# Patient Record
Sex: Male | Born: 1995 | Race: Black or African American | Hispanic: No | Marital: Single | Smoking: Never smoker
Health system: Southern US, Community
[De-identification: ages and names within clinical notes are randomized; demographics above are authoritative.]

## PROBLEM LIST (undated history)

## (undated) DIAGNOSIS — E119 Type 2 diabetes mellitus without complications: Secondary | ICD-10-CM

---
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
## 2018-05-06 IMAGING — CR DG ELBOW COMPLETE 3+V*L*
4 series · 4 of 4 positions shown · non-contrast
Comparison: None.

CLINICAL DATA: Posterior left elbow pain.  No injury.

EXAM:
LEFT ELBOW - COMPLETE 3+ VIEW

[x elbow joint ap left]
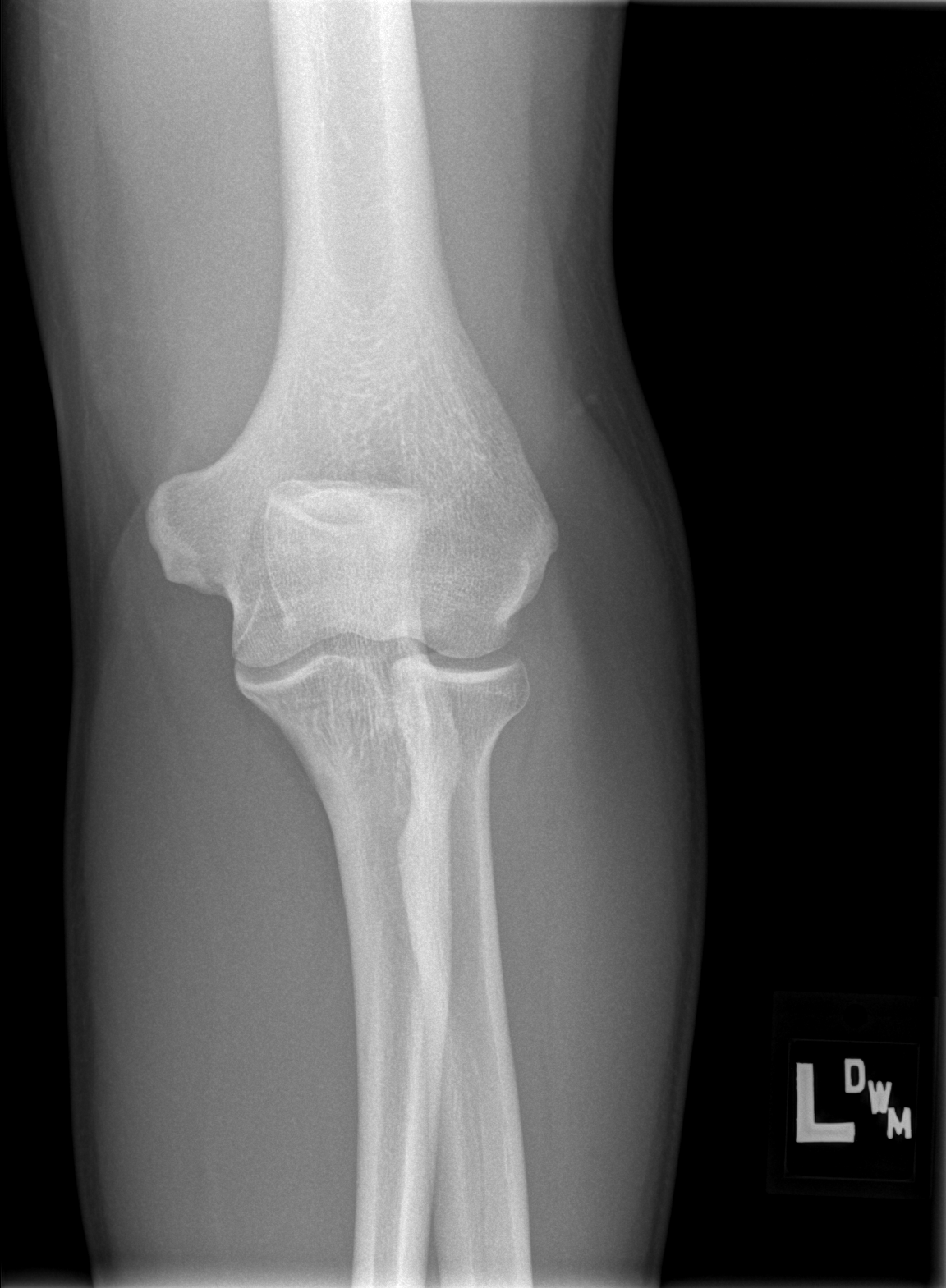

[x elbow joint obl. left (1 of 2)]
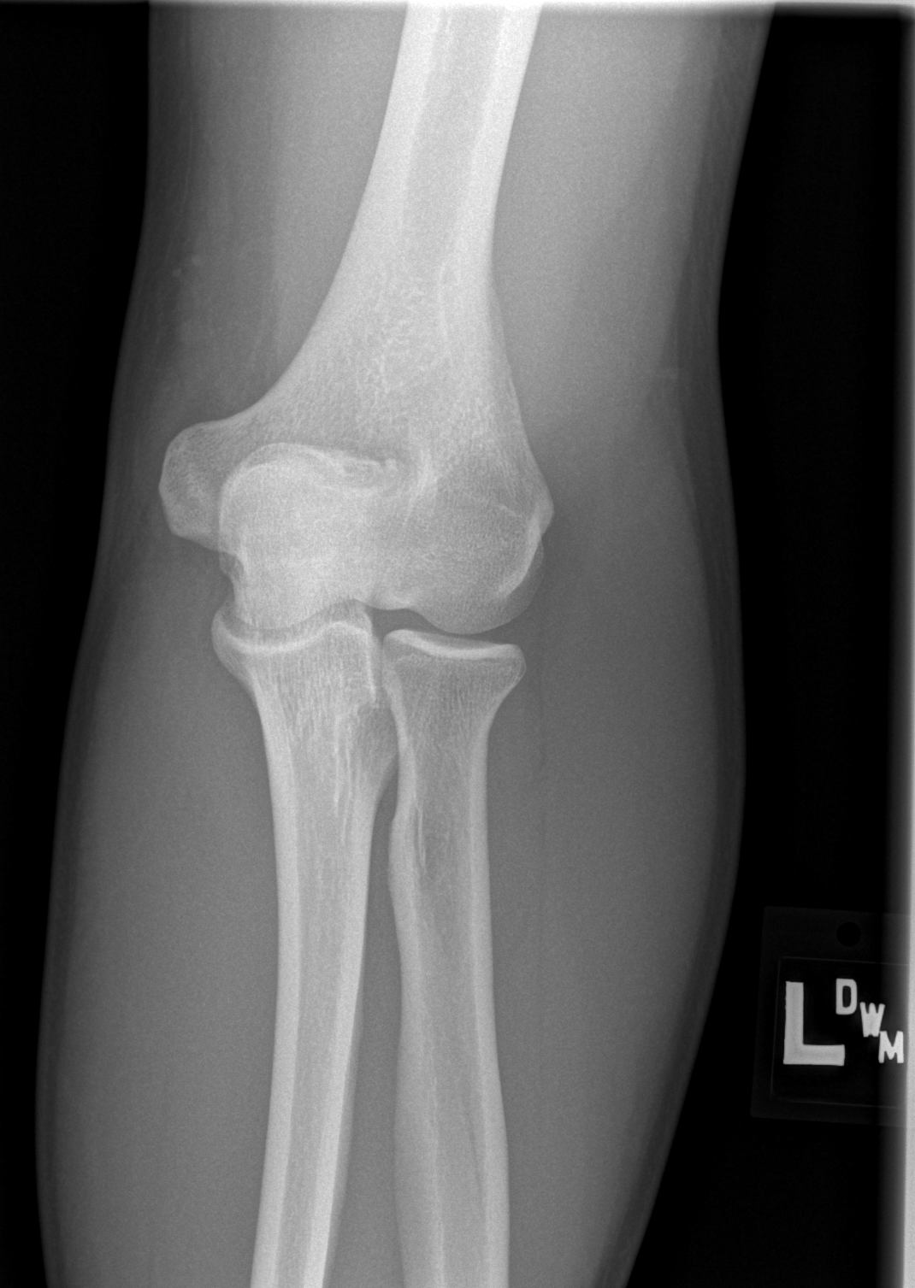

[x elbow joint obl. left (2 of 2)]
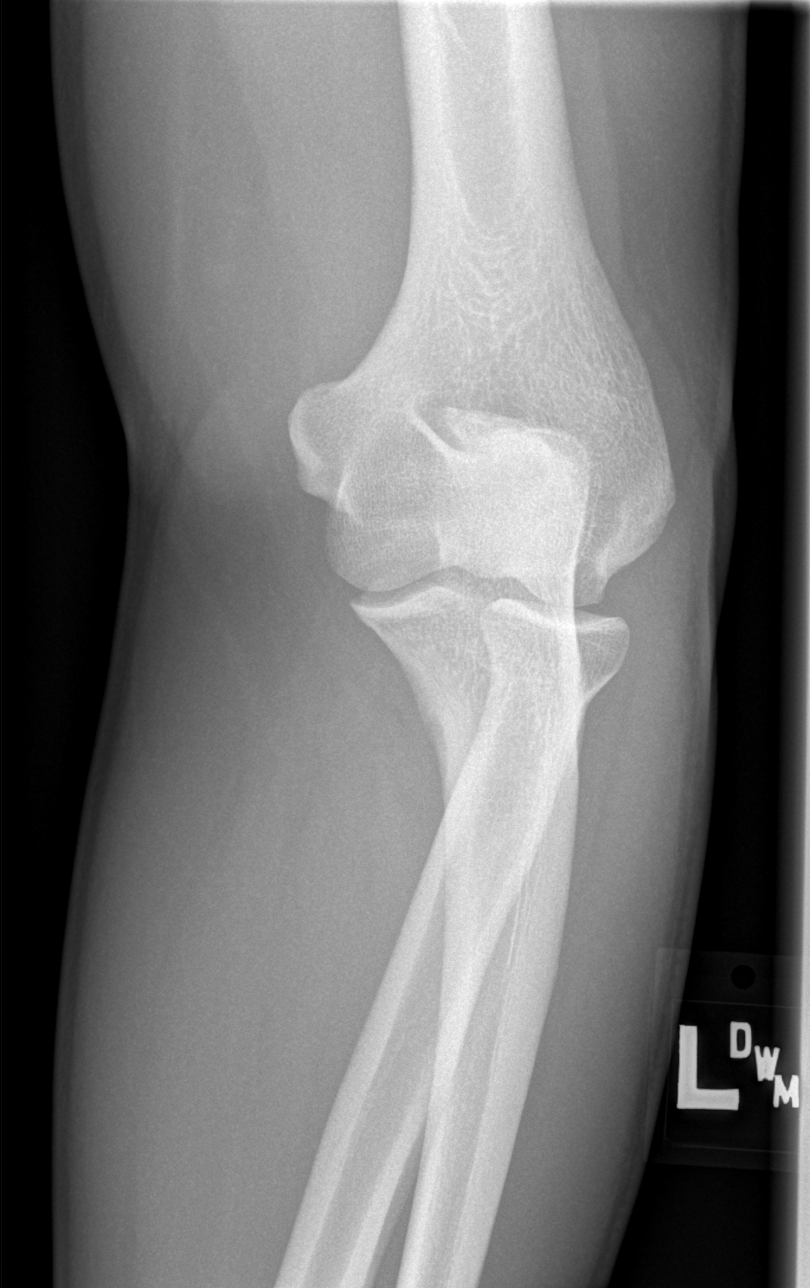

[x elbow joint lat left]
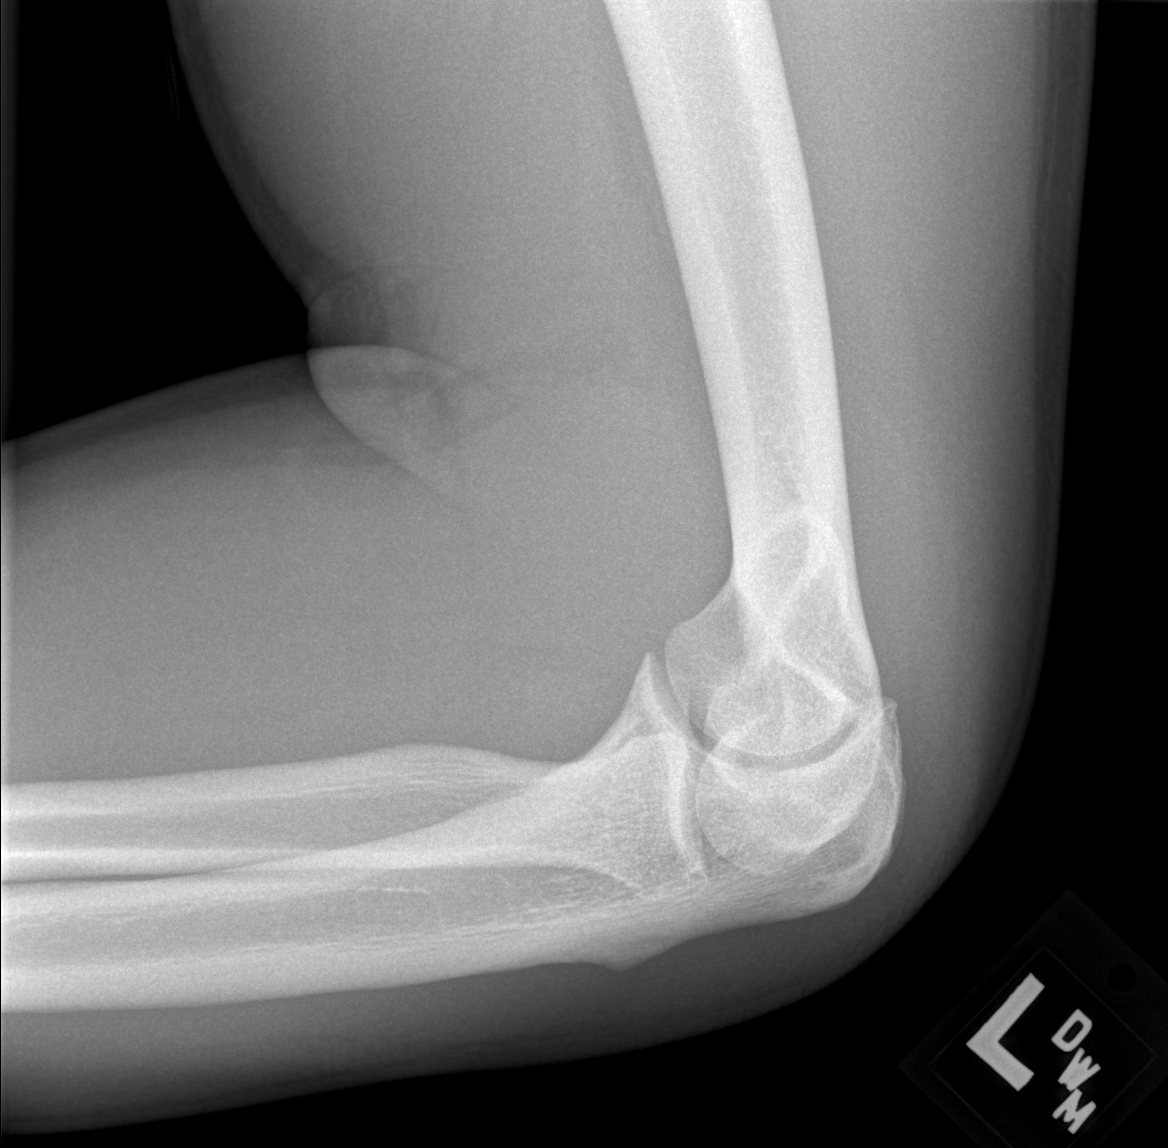

[4 of 4 positions shown; findings below may reference images not displayed]

FINDINGS: There is no evidence of fracture, dislocation, or joint effusion.
There is no evidence of arthropathy or other focal bone abnormality.
Soft tissues are unremarkable.
IMPRESSION: Negative.

## 2022-03-23 ENCOUNTER — Inpatient Hospital Stay (HOSPITAL_BASED_OUTPATIENT_CLINIC_OR_DEPARTMENT_OTHER)
Admission: EM | Admit: 2022-03-23 | Discharge: 2022-03-25 | DRG: 638 | Disposition: A | Discharge status: Home / Self Care | Payer: BC Managed Care – PPO | Attending: Student | Admitting: Student

## 2022-03-23 ENCOUNTER — Encounter (HOSPITAL_BASED_OUTPATIENT_CLINIC_OR_DEPARTMENT_OTHER): Payer: Self-pay | Admitting: Emergency Medicine

## 2022-03-23 ENCOUNTER — Other Ambulatory Visit: Payer: Self-pay

## 2022-03-23 DIAGNOSIS — E871 Hypo-osmolality and hyponatremia: Secondary | ICD-10-CM | POA: Diagnosis not present

## 2022-03-23 DIAGNOSIS — D751 Secondary polycythemia: Secondary | ICD-10-CM | POA: Insufficient documentation

## 2022-03-23 DIAGNOSIS — E860 Dehydration: Secondary | ICD-10-CM | POA: Diagnosis present

## 2022-03-23 DIAGNOSIS — E876 Hypokalemia: Secondary | ICD-10-CM | POA: Insufficient documentation

## 2022-03-23 DIAGNOSIS — E1100 Type 2 diabetes mellitus with hyperosmolarity without nonketotic hyperglycemic-hyperosmolar coma (NKHHC): Secondary | ICD-10-CM | POA: Diagnosis present

## 2022-03-23 DIAGNOSIS — N179 Acute kidney failure, unspecified: Secondary | ICD-10-CM | POA: Insufficient documentation

## 2022-03-23 DIAGNOSIS — E1110 Type 2 diabetes mellitus with ketoacidosis without coma: Principal | ICD-10-CM

## 2022-03-23 DIAGNOSIS — Z6835 Body mass index (BMI) 35.0-35.9, adult: Secondary | ICD-10-CM

## 2022-03-23 DIAGNOSIS — E119 Type 2 diabetes mellitus without complications: Secondary | ICD-10-CM | POA: Diagnosis not present

## 2022-03-23 DIAGNOSIS — E785 Hyperlipidemia, unspecified: Secondary | ICD-10-CM | POA: Diagnosis present

## 2022-03-23 HISTORY — DX: Type 2 diabetes mellitus without complications: E11.9

## 2022-03-23 LAB — URINALYSIS, MICROSCOPIC (REFLEX)

## 2022-03-23 LAB — CBC
HCT: 51.7 % (ref 39.0–52.0)
Hemoglobin: 17.7 g/dL — ABNORMAL HIGH (ref 13.0–17.0)
MCH: 27.8 pg (ref 26.0–34.0)
MCHC: 34.2 g/dL (ref 30.0–36.0)
MCV: 81.2 fL (ref 80.0–100.0)
Platelets: 231 K/uL (ref 150–400)
RBC: 6.37 MIL/uL — ABNORMAL HIGH (ref 4.22–5.81)
RDW: 12.9 % (ref 11.5–15.5)
WBC: 10.7 K/uL — ABNORMAL HIGH (ref 4.0–10.5)
nRBC: 0 % (ref 0.0–0.2)

## 2022-03-23 LAB — COMPREHENSIVE METABOLIC PANEL
ALT: 32 U/L (ref 0–44)
AST: 22 U/L (ref 15–41)
Albumin: 4.8 g/dL (ref 3.5–5.0)
Alkaline Phosphatase: 147 U/L — ABNORMAL HIGH (ref 38–126)
Anion gap: 21 — ABNORMAL HIGH (ref 5–15)
BUN: 24 mg/dL — ABNORMAL HIGH (ref 6–20)
CO2: 18 mmol/L — ABNORMAL LOW (ref 22–32)
Calcium: 9.5 mg/dL (ref 8.9–10.3)
Chloride: 91 mmol/L — ABNORMAL LOW (ref 98–111)
Creatinine, Ser: 1.7 mg/dL — ABNORMAL HIGH (ref 0.61–1.24)
GFR, Estimated: 57 mL/min — ABNORMAL LOW (ref >60)
Glucose, Bld: 683 mg/dL (ref 70–99)
Potassium: 5.1 mmol/L (ref 3.5–5.1)
Sodium: 130 mmol/L — ABNORMAL LOW (ref 135–145)
Total Bilirubin: 1.6 mg/dL — ABNORMAL HIGH (ref 0.3–1.2)
Total Protein: 9.1 g/dL — ABNORMAL HIGH (ref 6.5–8.1)

## 2022-03-23 LAB — I-STAT VENOUS BLOOD GAS, ED
Acid-base deficit: 5 mmol/L — ABNORMAL HIGH (ref 0.0–2.0)
Bicarbonate: 19.5 mmol/L — ABNORMAL LOW (ref 20.0–28.0)
Calcium, Ion: 1.19 mmol/L (ref 1.15–1.40)
HCT: 59 % — ABNORMAL HIGH (ref 39.0–52.0)
Hemoglobin: 20.1 g/dL — ABNORMAL HIGH (ref 13.0–17.0)
O2 Saturation: 91 %
Potassium: 5.3 mmol/L — ABNORMAL HIGH (ref 3.5–5.1)
Sodium: 133 mmol/L — ABNORMAL LOW (ref 135–145)
TCO2: 21 mmol/L — ABNORMAL LOW (ref 22–32)
pCO2, Ven: 35.2 mmHg — ABNORMAL LOW (ref 44–60)
pH, Ven: 7.352 (ref 7.25–7.43)
pO2, Ven: 65 mmHg — ABNORMAL HIGH (ref 32–45)

## 2022-03-23 LAB — BASIC METABOLIC PANEL
Anion gap: 16 — ABNORMAL HIGH (ref 5–15)
Anion gap: 13 (ref 5–15)
BUN: 21 mg/dL — ABNORMAL HIGH (ref 6–20)
BUN: 18 mg/dL (ref 6–20)
CO2: 23 mmol/L (ref 22–32)
CO2: 25 mmol/L (ref 22–32)
Calcium: 9.4 mg/dL (ref 8.9–10.3)
Calcium: 8.5 mg/dL — ABNORMAL LOW (ref 8.9–10.3)
Chloride: 101 mmol/L (ref 98–111)
Chloride: 101 mmol/L (ref 98–111)
Creatinine, Ser: 1.49 mg/dL — ABNORMAL HIGH (ref 0.61–1.24)
Creatinine, Ser: 1.31 mg/dL — ABNORMAL HIGH (ref 0.61–1.24)
GFR, Estimated: >60 mL/min (ref >60)
GFR, Estimated: >60 mL/min (ref >60)
Glucose, Bld: 317 mg/dL — ABNORMAL HIGH (ref 70–99)
Glucose, Bld: 188 mg/dL — ABNORMAL HIGH (ref 70–99)
Potassium: 3.8 mmol/L (ref 3.5–5.1)
Potassium: 3.1 mmol/L — ABNORMAL LOW (ref 3.5–5.1)
Sodium: 140 mmol/L (ref 135–145)
Sodium: 139 mmol/L (ref 135–145)

## 2022-03-23 LAB — URINALYSIS, ROUTINE W REFLEX MICROSCOPIC
Bilirubin Urine: NEGATIVE
Glucose, UA: >=500 mg/dL — AB
Ketones, ur: 80 mg/dL — AB
Leukocytes,Ua: NEGATIVE
Nitrite: NEGATIVE
Protein, ur: NEGATIVE mg/dL
Specific Gravity, Urine: 1.01 (ref 1.005–1.030)
pH: 5 (ref 5.0–8.0)

## 2022-03-23 LAB — GLUCOSE, CAPILLARY
Glucose-Capillary: 206 mg/dL — ABNORMAL HIGH (ref 70–99)
Glucose-Capillary: 235 mg/dL — ABNORMAL HIGH (ref 70–99)
Glucose-Capillary: 244 mg/dL — ABNORMAL HIGH (ref 70–99)
Glucose-Capillary: 252 mg/dL — ABNORMAL HIGH (ref 70–99)
Glucose-Capillary: 270 mg/dL — ABNORMAL HIGH (ref 70–99)
Glucose-Capillary: 319 mg/dL — ABNORMAL HIGH (ref 70–99)
Glucose-Capillary: 399 mg/dL — ABNORMAL HIGH (ref 70–99)

## 2022-03-23 LAB — CBC WITH DIFFERENTIAL/PLATELET
Abs Immature Granulocytes: 0.02 10*3/uL (ref 0.00–0.07)
Basophils Absolute: 0.1 10*3/uL (ref 0.0–0.1)
Basophils Relative: 1 %
Eosinophils Absolute: 0 10*3/uL (ref 0.0–0.5)
Eosinophils Relative: 0 %
HCT: 54.9 % — ABNORMAL HIGH (ref 39.0–52.0)
Hemoglobin: 19 g/dL — ABNORMAL HIGH (ref 13.0–17.0)
Immature Granulocytes: 0 %
Lymphocytes Relative: 26 %
Lymphs Abs: 2.4 10*3/uL (ref 0.7–4.0)
MCH: 27.8 pg (ref 26.0–34.0)
MCHC: 34.6 g/dL (ref 30.0–36.0)
MCV: 80.3 fL (ref 80.0–100.0)
Monocytes Absolute: 0.7 10*3/uL (ref 0.1–1.0)
Monocytes Relative: 8 %
Neutro Abs: 6.3 10*3/uL (ref 1.7–7.7)
Neutrophils Relative %: 65 %
Platelets: 229 10*3/uL (ref 150–400)
RBC: 6.84 MIL/uL — ABNORMAL HIGH (ref 4.22–5.81)
RDW: 13.7 % (ref 11.5–15.5)
WBC: 9.5 10*3/uL (ref 4.0–10.5)
nRBC: 0 % (ref 0.0–0.2)

## 2022-03-23 LAB — CBG MONITORING, ED
Glucose-Capillary: >600 mg/dL (ref 70–99)
Glucose-Capillary: 495 mg/dL — ABNORMAL HIGH (ref 70–99)

## 2022-03-23 LAB — BETA-HYDROXYBUTYRIC ACID
Beta-Hydroxybutyric Acid: 4.66 mmol/L — ABNORMAL HIGH (ref 0.05–0.27)
Beta-Hydroxybutyric Acid: 0.88 mmol/L — ABNORMAL HIGH (ref 0.05–0.27)

## 2022-03-23 LAB — HEMOGLOBIN A1C
Hgb A1c MFr Bld: 13.1 % — ABNORMAL HIGH (ref 4.8–5.6)
Mean Plasma Glucose: 329.27 mg/dL

## 2022-03-23 LAB — HIV ANTIBODY (ROUTINE TESTING W REFLEX): HIV Screen 4th Generation wRfx: NONREACTIVE

## 2022-03-23 MED ORDER — LACTATED RINGERS IV SOLN: INTRAVENOUS | Status: DC

## 2022-03-23 MED ORDER — METOPROLOL TARTRATE 5 MG/5ML IV SOLN: 5.0000 mg | INTRAVENOUS | Status: DC | PRN

## 2022-03-23 MED ORDER — DM-GUAIFENESIN ER 30-600 MG PO TB12: 1.0000 | ORAL_TABLET | Freq: Two times a day (BID) | ORAL | Status: DC | PRN

## 2022-03-23 MED ORDER — PANTOPRAZOLE SODIUM 40 MG PO TBEC
40.0000 mg | DELAYED_RELEASE_TABLET | Freq: Every day | ORAL | Status: DC
  Administered 2022-03-24: 40 mg via ORAL
  Filled 2022-03-23: qty 1

## 2022-03-23 MED ORDER — SODIUM CHLORIDE 0.9 % IV BOLUS
1000.0000 mL | Freq: Once | INTRAVENOUS | Status: AC
  Administered 2022-03-23: 1000 mL via INTRAVENOUS

## 2022-03-23 MED ORDER — ACETAMINOPHEN 325 MG PO TABS: 650.0000 mg | ORAL_TABLET | Freq: Four times a day (QID) | ORAL | Status: DC | PRN

## 2022-03-23 MED ORDER — LACTATED RINGERS IV BOLUS
2000.0000 mL | Freq: Once | INTRAVENOUS | Status: AC
  Administered 2022-03-23: 1000 mL via INTRAVENOUS
  Administered 2022-03-23: 2000 mL via INTRAVENOUS

## 2022-03-23 MED ORDER — GUAIFENESIN 100 MG/5ML PO LIQD: 5.0000 mL | ORAL | Status: DC | PRN

## 2022-03-23 MED ORDER — ONDANSETRON HCL 4 MG/2ML IJ SOLN: 4.0000 mg | Freq: Four times a day (QID) | INTRAMUSCULAR | Status: DC | PRN

## 2022-03-23 MED ORDER — ONDANSETRON HCL 4 MG/2ML IJ SOLN
4.0000 mg | Freq: Once | INTRAMUSCULAR | Status: AC
  Administered 2022-03-23: 4 mg via INTRAVENOUS
  Filled 2022-03-23: qty 2

## 2022-03-23 MED ORDER — IPRATROPIUM-ALBUTEROL 0.5-2.5 (3) MG/3ML IN SOLN: 3.0000 mL | RESPIRATORY_TRACT | Status: DC | PRN

## 2022-03-23 MED ORDER — SODIUM CHLORIDE 0.9 % IV SOLN: INTRAVENOUS | Status: DC | PRN

## 2022-03-23 MED ORDER — HYDRALAZINE HCL 20 MG/ML IJ SOLN: 10.0000 mg | INTRAMUSCULAR | Status: DC | PRN

## 2022-03-23 MED ORDER — DEXTROSE IN LACTATED RINGERS 5 % IV SOLN: INTRAVENOUS | Status: DC

## 2022-03-23 MED ORDER — ENOXAPARIN SODIUM 40 MG/0.4ML IJ SOSY
40.0000 mg | PREFILLED_SYRINGE | INTRAMUSCULAR | Status: DC
  Administered 2022-03-23 – 2022-03-24 (×2): 40 mg via SUBCUTANEOUS
  Filled 2022-03-23 (×2): qty 0.4

## 2022-03-23 MED ORDER — SENNOSIDES-DOCUSATE SODIUM 8.6-50 MG PO TABS: 1.0000 | ORAL_TABLET | Freq: Every evening | ORAL | Status: DC | PRN

## 2022-03-23 MED ORDER — TRAZODONE HCL 50 MG PO TABS: 50.0000 mg | ORAL_TABLET | Freq: Every evening | ORAL | Status: DC | PRN

## 2022-03-23 MED ORDER — DEXTROSE 50 % IV SOLN: 0.0000 mL | INTRAVENOUS | Status: DC | PRN

## 2022-03-23 MED ORDER — OXYCODONE HCL 5 MG PO TABS: 5.0000 mg | ORAL_TABLET | ORAL | Status: DC | PRN

## 2022-03-23 MED ORDER — INSULIN REGULAR(HUMAN) IN NACL 100-0.9 UT/100ML-% IV SOLN
INTRAVENOUS | Status: DC
  Administered 2022-03-23: 6.5 [IU]/h via INTRAVENOUS
  Administered 2022-03-24: 10 [IU]/h via INTRAVENOUS
  Filled 2022-03-23 (×2): qty 100

## 2022-03-23 NOTE — Plan of Care (Signed)

## 2022-03-23 NOTE — ED Notes (Signed)
Engineer, technical sales requested

## 2022-03-23 NOTE — ED Notes (Signed)
[facility] Team at Bedside, report to [employer]

## 2022-03-23 NOTE — ED Notes (Signed)
Called [facility] for transport at 12:36.

## 2022-03-23 NOTE — ED Notes (Signed)
Remains very alert and oriented, mae x 4, resp remain WNL, able to stand to use urinal without assistane

## 2022-03-23 NOTE — H&P (Signed)
History and Physical    Victor Duncan IWP:[id number] DOB: Jan 05, 1996 DOA: 03/23/2022  PCP: Patient, [name] Patient coming from: Home  Chief Complaint: Nausea vomiting  HPI: Victor Duncan is a 27 y.o. male with medical history significant of prediabetes comes to the hospital with complaints of nausea, vomiting and loss of appetite for the past 5 days.  Patient states for the past 5 days he has had increasing amount of nausea, vomiting, slight abdominal discomfort and loss of appetite.  The symptoms continue to progress and started feeling dehydrated.  This is why decided to come to the hospital. He tells me several years ago he was told he was prediabetic but does not take any medications at home.  In the ER patient was noted to be in diabetic ketoacidosis and started on Endo tool.  Patient was transferred to [facility] from [place] for further management.   Review of Systems: As per HPI otherwise 10 point review of systems negative.  Review of Systems Otherwise negative except as per HPI, including: General: Denies fever, chills, night sweats or unintended weight loss. Resp: Denies cough, wheezing, shortness of breath. Cardiac: Denies chest pain, palpitations, orthopnea, paroxysmal nocturnal dyspnea. GI: Denies  diarrhea or constipation GU: Denies dysuria, frequency, hesitancy or incontinence MS: Denies muscle aches, joint pain or swelling Neuro: Denies headache, neurologic deficits (focal weakness, numbness, tingling), abnormal gait Psych: Denies anxiety, depression, SI/HI/AVH Skin: Denies new rashes or lesions ID: Denies sick contacts, exotic exposures, travel  Past Medical History:  Diagnosis Date   Diabetes mellitus without complication ([place])     History reviewed. No pertinent surgical history.  SOCIAL HISTORY:  reports that he has never smoked. He has never used smokeless tobacco. He reports current alcohol use. He reports that he does not use  drugs.  No Known Allergies  FAMILY HISTORY: History reviewed. No pertinent family history.   Prior to Admission medications   Not on File    Physical Exam: Vitals:   03/23/22 1215 03/23/22 1247 03/23/22 1300 03/23/22 1425  BP: (!) 154/102 (!) 148/103 (!) 150/78 (!) 163/84  Pulse: (!) 122 (!) 124 (!) 116 (!) 130  Resp: 18 18  15   Temp:    97.6 F (36.4 C)  TempSrc:    Oral  SpO2: 98% 100% 98% 97%  Weight:    93 kg  Height:    5\' 4"  (1.626 m)      Constitutional: NAD, calm, comfortable Eyes: PERRL, lids and conjunctivae normal ENMT: Mucous membranes are dry. Posterior pharynx clear of any exudate or lesions.Normal dentition.  Neck: normal, supple, no masses, no thyromegaly Respiratory: clear to auscultation bilaterally, no wheezing, no crackles. Normal respiratory effort. No accessory muscle use.  Cardiovascular: Sinus tachycardia, regular rate and rhythm, no murmurs / rubs / gallops. No extremity edema. 2+ pedal pulses. No carotid bruits.  Abdomen: no tenderness, no masses palpated. No hepatosplenomegaly. Bowel sounds positive.  Musculoskeletal: no clubbing / cyanosis. No joint deformity upper and lower extremities. Good ROM, no contractures. Normal muscle tone.  Skin: no rashes, lesions, ulcers. No induration Neurologic: CN 2-12 grossly intact. Sensation intact, DTR normal. Strength 5/5 in all 4.  Psychiatric: Normal judgment and insight. Alert and oriented x 3. Normal mood.     Labs on Admission: I have personally reviewed following labs and imaging studies  CBC: Recent Labs  Lab 03/23/22 1119 03/23/22 1128  WBC 9.5  --   NEUTROABS 6.3  --   HGB 19.0* 20.1*  HCT  54.9* 59.0*  MCV 80.3  --   PLT 229  --    Basic Metabolic Panel: Recent Labs  Lab 03/23/22 1119 03/23/22 1128  NA 130* 133*  K 5.1 5.3*  CL 91*  --   CO2 18*  --   GLUCOSE 683*  --   BUN 24*  --   CREATININE 1.70*  --   CALCIUM 9.5  --    GFR: Estimated Creatinine Clearance: 68.3 mL/min  (A) (by C-G formula based on SCr of 1.7 mg/dL (H)). Liver Function Tests: Recent Labs  Lab 03/23/22 1119  AST 22  ALT 32  ALKPHOS 147*  BILITOT 1.6*  PROT 9.1*  ALBUMIN 4.8   No results for input(s): "LIPASE", "AMYLASE" in the last 168 hours. No results for input(s): "AMMONIA" in the last 168 hours. Coagulation Profile: No results for input(s): "INR", "PROTIME" in the last 168 hours. Cardiac Enzymes: No results for input(s): "CKTOTAL", "CKMB", "CKMBINDEX", "TROPONINI" in the last 168 hours. BNP (last 3 results) No results for input(s): "PROBNP" in the last 8760 hours. HbA1C: No results for input(s): "HGBA1C" in the last 72 hours. CBG: Recent Labs  Lab 03/23/22 1120 03/23/22 1315 03/23/22 1419  GLUCAP >600* 495* 399*   Lipid Profile: No results for input(s): "CHOL", "HDL", "LDLCALC", "TRIG", "CHOLHDL", "LDLDIRECT" in the last 72 hours. Thyroid Function Tests: No results for input(s): "TSH", "T4TOTAL", "FREET4", "T3FREE", "THYROIDAB" in the last 72 hours. Anemia Panel: No results for input(s): "VITAMINB12", "FOLATE", "FERRITIN", "TIBC", "IRON", "RETICCTPCT" in the last 72 hours. Urine analysis:    Component Value Date/Time   COLORURINE STRAW (A) 03/23/2022 1112   APPEARANCEUR CLEAR 03/23/2022 1112   LABSPEC 1.010 03/23/2022 1112   PHURINE 5.0 03/23/2022 1112   GLUCOSEU >=500 (A) 03/23/2022 1112   HGBUR TRACE (A) 03/23/2022 1112   BILIRUBINUR NEGATIVE 03/23/2022 1112   KETONESUR 80 (A) 03/23/2022 1112   PROTEINUR NEGATIVE 03/23/2022 1112   NITRITE NEGATIVE 03/23/2022 1112   LEUKOCYTESUR NEGATIVE 03/23/2022 1112   Sepsis Labs: !!!!!!!!!!!!!!!!!!!!!!!!!!!!!!!!!!!!!!!!!!!! @LABRCNTIP (procalcitonin:4,lacticidven:4) )No results found for this or any previous visit (from the past 240 hour(s)).   Radiological Exams on Admission: No results found.   All images have been reviewed by me personally.   Assessment/Plan Principal Problem:   Hyperosmolar hyperglycemic  state (HHS) ([place]) Active Problems:   DKA (diabetic ketoacidosis) ([place])    Diabetic ketoacidosis Sinus tachycardia, moderate dehydration -Suspect new onset diabetes mellitus.  Currently patient started on DKA protocol including insulin drip, IV fluids.  Will do periodic blood work to make sure his anion gap is closing.  Currently pH is 7.35.  Keep him n.p.o. and slowly resume diet as tolerated.  Check A1c, diabetic coordinator consulted. Supportive care  Acute kidney injury - Admission creatinine 1.7, should improve with IV fluids No prior report of baseline  Elevated hemoglobin - Suspect hemoconcentrated.  Continue to monitor hemoglobin      DVT prophylaxis: Lovenox Code Status: Full code Family Communication: None Consults called: Diabetic coordinator Admission status: Stepdown admission     Time Spent: 65 minutes.  >50% of the time was devoted to discussing the patients care, assessment, plan and disposition with other care givers along with counseling the patient about the risks and benefits of treatment.    Lamario Mani Arsenio Loader MD [facility]  If 7PM-7AM, please contact night-coverage   03/23/2022, 2:57 PM

## 2022-03-23 NOTE — ED Notes (Signed)
Nursing Assessment: Client presented from Urgent Care due to symptoms of increased voiding, N/V, thirst, generalized of not feeling well. Upon arrival placed on cont cardiac monitor and CBG / Labs assessed, Noted to be very tachycardiac, HR>130/min. CBG indicated HHH, blood drawn to confirm glucose readings.Skin warm and dry, resp even and non-labored. EDP in to see client, orders rec

## 2022-03-23 NOTE — ED Provider Notes (Signed)
[place] EMERGENCY DEPARTMENT AT [facility] [facility] Provider Note   CSN: [id number] Arrival date & time: 03/23/22  1103     History  Chief Complaint  Patient presents with   Hyperglycemia    Victor Duncan is a 27 y.o. male.  27 yo M with a chief complaints of not feeling well.  He says for about a week now he has felt very bad has had recurrent nausea and vomiting unable to eat and drink.  He initially had increased urinary frequency that feels like that is gone down recently.  Has a headache after vomiting.  Been coughing a little bit.  His girlfriend had the flu a couple weeks ago.        Home Medications Prior to Admission medications   Not on File      Allergies    Patient has no known allergies.    Review of Systems   Review of Systems  Physical Exam Updated Vital Signs BP (!) 154/102 (BP Location: Right Arm)   Pulse (!) 122   Temp 97.6 F (36.4 C) (Oral)   Resp 18   Ht 5\' 4"  (1.626 m)   Wt 93.3 kg   SpO2 98%   BMI 35.31 kg/m  Physical Exam Vitals and nursing note reviewed.  Constitutional:      Appearance: He is well-developed.  HENT:     Head: Normocephalic and atraumatic.     Mouth/Throat:     Comments: Thrush noted Eyes:     Pupils: Pupils are equal, round, and reactive to light.  Neck:     Vascular: No JVD.  Cardiovascular:     Rate and Rhythm: Normal rate and regular rhythm.     Heart sounds: No murmur heard.    No friction rub. No gallop.  Pulmonary:     Effort: No respiratory distress.     Breath sounds: No wheezing.  Abdominal:     General: There is no distension.     Tenderness: There is no abdominal tenderness. There is no guarding or rebound.  Musculoskeletal:        General: Normal range of motion.     Cervical back: Normal range of motion and neck supple.  Skin:    Coloration: Skin is not pale.     Findings: No rash.  Neurological:     Mental Status: He is alert and oriented to person, place, and time.   Psychiatric:        Behavior: Behavior normal.     ED Results / Procedures / Treatments   Labs (all labs ordered are listed, but only abnormal results are displayed) Labs Reviewed  CBC WITH DIFFERENTIAL/PLATELET - Abnormal; Notable for the following components:      Result Value   RBC 6.84 (*)    Hemoglobin 19.0 (*)    HCT 54.9 (*)    All other components within normal limits  COMPREHENSIVE METABOLIC PANEL - Abnormal; Notable for the following components:   Sodium 130 (*)    Chloride 91 (*)    CO2 18 (*)    Glucose, Bld 683 (*)    BUN 24 (*)    Creatinine, Ser 1.70 (*)    Total Protein 9.1 (*)    Alkaline Phosphatase 147 (*)    Total Bilirubin 1.6 (*)    GFR, Estimated 57 (*)    Anion gap 21 (*)    All other components within normal limits  CBG MONITORING, ED - Abnormal; Notable for the following  components:   Glucose-Capillary >600 (*)    All other components within normal limits  I-STAT VENOUS BLOOD GAS, ED - Abnormal; Notable for the following components:   pCO2, Ven 35.2 (*)    pO2, Ven 65 (*)    Bicarbonate 19.5 (*)    TCO2 21 (*)    Acid-base deficit 5.0 (*)    Sodium 133 (*)    Potassium 5.3 (*)    HCT 59.0 (*)    Hemoglobin 20.1 (*)    All other components within normal limits  URINALYSIS, ROUTINE W REFLEX MICROSCOPIC  BETA-HYDROXYBUTYRIC ACID  BASIC METABOLIC PANEL  BASIC METABOLIC PANEL  BETA-HYDROXYBUTYRIC ACID  BASIC METABOLIC PANEL  CBG MONITORING, ED    EKG EKG Interpretation  Date/Time:  Saturday March 23 2022 11:30:44 EST Ventricular Rate:  135 PR Interval:  123 QRS Duration: 77 QT Interval:  294 QTC Calculation: 441 R Axis:   123 Text Interpretation: Sinus tachycardia Anterolateral infarct, age indeterminate Abnormal T, consider ischemia, inferior leads No significant change since last tracing Confirmed by Deno Etienne [phone]) on 03/23/2022 11:34:54 AM  Radiology No results found.  Procedures .Critical Care  Performed by: Deno Etienne, DO Authorized by: Deno Etienne, DO   Critical care provider statement:    Critical care time (minutes):  35   Critical care time was exclusive of:  Separately billable procedures and treating other patients   Critical care was time spent personally by me on the following activities:  Development of treatment plan with patient or surrogate, discussions with consultants, evaluation of patient's response to treatment, examination of patient, ordering and review of laboratory studies, ordering and review of radiographic studies, ordering and performing treatments and interventions, pulse oximetry, re-evaluation of patient's condition and review of old charts   Care discussed with: admitting provider       Medications Ordered in ED Medications  0.9 %  sodium chloride infusion ( Intravenous New Bag/Given 03/23/22 1135)  insulin regular, human (MYXREDLIN) 100 units/ 100 mL infusion (has no administration in time range)  lactated ringers infusion (has no administration in time range)  dextrose 5 % in lactated ringers infusion (has no administration in time range)  dextrose 50 % solution 0-50 mL (has no administration in time range)  lactated ringers bolus 2,000 mL (1,000 mLs Intravenous New Bag/Given 03/23/22 1134)  ondansetron (ZOFRAN) injection 4 mg (4 mg Intravenous Given 03/23/22 1143)    ED Course/ Medical Decision Making/ A&P                             Medical Decision Making Amount and/or Complexity of Data Reviewed Labs: ordered.  Risk Prescription drug management. Decision regarding hospitalization.   27 yo M with a chief complaints of not feeling well.  This been going on for about a week.  Patient's history is most concerning for diabetic ketoacidosis, no history of diabetes.  Will check blood sugar blood work with heart rates going in the 140s.  Bolus of IV fluids.  Reassess.  Blood work is consistent with diabetic ketoacidosis hyperglycemia to greater than 650, acidosis with  anion gap.  Started on insulin infusion.  VBG without significant acidosis.  Discussed with medicine for admission.  The patients results and plan were reviewed and discussed.   Any x-rays performed were independently reviewed by myself.   Differential diagnosis were considered with the presenting HPI.  Medications  0.9 %  sodium chloride infusion ( Intravenous  New Bag/Given 03/23/22 1135)  insulin regular, human (MYXREDLIN) 100 units/ 100 mL infusion (has no administration in time range)  lactated ringers infusion (has no administration in time range)  dextrose 5 % in lactated ringers infusion (has no administration in time range)  dextrose 50 % solution 0-50 mL (has no administration in time range)  lactated ringers bolus 2,000 mL (1,000 mLs Intravenous New Bag/Given 03/23/22 1134)  ondansetron (ZOFRAN) injection 4 mg (4 mg Intravenous Given 03/23/22 1143)    Vitals:   03/23/22 1110 03/23/22 1130 03/23/22 1215  BP: (!) 152/108  (!) 154/102  Pulse: (!) 120  (!) 122  Resp: 18  18  Temp: 97.6 F (36.4 C)    TempSrc: Oral    SpO2: 97%  98%  Weight: 92.1 kg 93.3 kg   Height: 5\' 4"  (1.626 m)      Final diagnoses:  Diabetic ketoacidosis without coma associated with type 2 diabetes mellitus ([place])    Admission/ observation were discussed with the admitting physician, patient and/or family and they are comfortable with the plan.          Final Clinical Impression(s) / ED Diagnoses Final diagnoses:  Diabetic ketoacidosis without coma associated with type 2 diabetes mellitus [facility])    Rx / DC Orders ED Discharge Orders     None         Deno Etienne, DO 03/23/22 1233

## 2022-03-23 NOTE — ED Notes (Signed)
Phone Handoff Report provided to rec RN at [facility] ICU 

## 2022-03-23 NOTE — ED Triage Notes (Signed)
Pt arrives pov, steady gait, referral from UC regarding abdominal pain and "dry mouth" x 6 days.

## 2022-03-23 NOTE — ED Notes (Signed)
EKG obtained and given to Dr. Tyrone Nine. Patient on cardiac monitor. VBG results given to provider as time of EKG.

## 2022-03-23 NOTE — Progress Notes (Signed)
Plan of Care Note for accepted transfer   Patient: Victor Duncan MRN: [id number]   [place]: 03/23/2022  Facility requesting transfer: [facility] Requesting Provider: Dr. Anniebell Bedore Nine Reason for transfer: DKA/HHS Facility course: 27 yo M w/ no significant PMHx. Presenting with N/V. W/u revealed new onset DM, HHS/DKA. He was started on Endotool, fluids.  Plan of care: The patient is accepted for admission to [facility] unit, at [facility].  While holding at [facility], medical decision making responsibilities remain with the [place]. Upon arrival to [facility], [facility] will assume care. Thank you.  Author: Jonnie Finner, DO 03/23/2022  Check [email or web address] for on-call coverage.  Nursing staff, Please call [place] number on Amion as soon as patient's arrival, so appropriate admitting provider can evaluate the pt.

## 2022-03-24 DIAGNOSIS — E871 Hypo-osmolality and hyponatremia: Secondary | ICD-10-CM | POA: Insufficient documentation

## 2022-03-24 DIAGNOSIS — E1110 Type 2 diabetes mellitus with ketoacidosis without coma: Secondary | ICD-10-CM

## 2022-03-24 DIAGNOSIS — E119 Type 2 diabetes mellitus without complications: Secondary | ICD-10-CM

## 2022-03-24 DIAGNOSIS — N179 Acute kidney failure, unspecified: Secondary | ICD-10-CM | POA: Diagnosis not present

## 2022-03-24 DIAGNOSIS — E876 Hypokalemia: Secondary | ICD-10-CM | POA: Insufficient documentation

## 2022-03-24 DIAGNOSIS — D751 Secondary polycythemia: Secondary | ICD-10-CM | POA: Insufficient documentation

## 2022-03-24 LAB — GLUCOSE, CAPILLARY
Glucose-Capillary: 122 mg/dL — ABNORMAL HIGH (ref 70–99)
Glucose-Capillary: 129 mg/dL — ABNORMAL HIGH (ref 70–99)
Glucose-Capillary: 163 mg/dL — ABNORMAL HIGH (ref 70–99)
Glucose-Capillary: 177 mg/dL — ABNORMAL HIGH (ref 70–99)
Glucose-Capillary: 185 mg/dL — ABNORMAL HIGH (ref 70–99)
Glucose-Capillary: 188 mg/dL — ABNORMAL HIGH (ref 70–99)
Glucose-Capillary: 189 mg/dL — ABNORMAL HIGH (ref 70–99)
Glucose-Capillary: 189 mg/dL — ABNORMAL HIGH (ref 70–99)
Glucose-Capillary: 193 mg/dL — ABNORMAL HIGH (ref 70–99)
Glucose-Capillary: 204 mg/dL — ABNORMAL HIGH (ref 70–99)
Glucose-Capillary: 211 mg/dL — ABNORMAL HIGH (ref 70–99)
Glucose-Capillary: 220 mg/dL — ABNORMAL HIGH (ref 70–99)
Glucose-Capillary: 226 mg/dL — ABNORMAL HIGH (ref 70–99)
Glucose-Capillary: 232 mg/dL — ABNORMAL HIGH (ref 70–99)
Glucose-Capillary: 232 mg/dL — ABNORMAL HIGH (ref 70–99)
Glucose-Capillary: 232 mg/dL — ABNORMAL HIGH (ref 70–99)
Glucose-Capillary: 234 mg/dL — ABNORMAL HIGH (ref 70–99)
Glucose-Capillary: 251 mg/dL — ABNORMAL HIGH (ref 70–99)
Glucose-Capillary: 300 mg/dL — ABNORMAL HIGH (ref 70–99)
Glucose-Capillary: 86 mg/dL (ref 70–99)

## 2022-03-24 LAB — CBC
HCT: 44 % (ref 39.0–52.0)
Hemoglobin: 14.6 g/dL (ref 13.0–17.0)
MCH: 27.7 pg (ref 26.0–34.0)
MCHC: 33.2 g/dL (ref 30.0–36.0)
MCV: 83.3 fL (ref 80.0–100.0)
Platelets: 168 10*3/uL (ref 150–400)
RBC: 5.28 MIL/uL (ref 4.22–5.81)
RDW: 13.1 % (ref 11.5–15.5)
WBC: 7.3 10*3/uL (ref 4.0–10.5)
nRBC: 0 % (ref 0.0–0.2)

## 2022-03-24 LAB — BASIC METABOLIC PANEL
Anion gap: 10 (ref 5–15)
Anion gap: 12 (ref 5–15)
Anion gap: 8 (ref 5–15)
BUN: 18 mg/dL (ref 6–20)
BUN: 17 mg/dL (ref 6–20)
BUN: 17 mg/dL (ref 6–20)
CO2: 25 mmol/L (ref 22–32)
CO2: 25 mmol/L (ref 22–32)
CO2: 25 mmol/L (ref 22–32)
Calcium: 8.3 mg/dL — ABNORMAL LOW (ref 8.9–10.3)
Calcium: 8.2 mg/dL — ABNORMAL LOW (ref 8.9–10.3)
Calcium: 8 mg/dL — ABNORMAL LOW (ref 8.9–10.3)
Chloride: 102 mmol/L (ref 98–111)
Chloride: 99 mmol/L (ref 98–111)
Chloride: 102 mmol/L (ref 98–111)
Creatinine, Ser: 1.32 mg/dL — ABNORMAL HIGH (ref 0.61–1.24)
Creatinine, Ser: 1.24 mg/dL (ref 0.61–1.24)
Creatinine, Ser: 1.19 mg/dL (ref 0.61–1.24)
GFR, Estimated: >60 mL/min (ref >60)
GFR, Estimated: >60 mL/min (ref >60)
GFR, Estimated: >60 mL/min (ref >60)
Glucose, Bld: 224 mg/dL — ABNORMAL HIGH (ref 70–99)
Glucose, Bld: 221 mg/dL — ABNORMAL HIGH (ref 70–99)
Glucose, Bld: 232 mg/dL — ABNORMAL HIGH (ref 70–99)
Potassium: 2.7 mmol/L — CL (ref 3.5–5.1)
Potassium: 3.3 mmol/L — ABNORMAL LOW (ref 3.5–5.1)
Potassium: 3.5 mmol/L (ref 3.5–5.1)
Sodium: 137 mmol/L (ref 135–145)
Sodium: 136 mmol/L (ref 135–145)
Sodium: 135 mmol/L (ref 135–145)

## 2022-03-24 LAB — MAGNESIUM: Magnesium: 2.1 mg/dL (ref 1.7–2.4)

## 2022-03-24 LAB — BETA-HYDROXYBUTYRIC ACID
Beta-Hydroxybutyric Acid: 1.54 mmol/L — ABNORMAL HIGH (ref 0.05–0.27)
Beta-Hydroxybutyric Acid: 0.5 mmol/L — ABNORMAL HIGH (ref 0.05–0.27)

## 2022-03-24 MED ORDER — CHLORHEXIDINE GLUCONATE CLOTH 2 % EX PADS
6.0000 | MEDICATED_PAD | Freq: Every day | CUTANEOUS | Status: DC
  Administered 2022-03-24: 6 via TOPICAL

## 2022-03-24 MED ORDER — INSULIN ASPART 100 UNIT/ML IJ SOLN
0.0000 [IU] | Freq: Three times a day (TID) | INTRAMUSCULAR | Status: DC
  Administered 2022-03-24: 3 [IU] via SUBCUTANEOUS
  Administered 2022-03-25 (×2): 5 [IU] via SUBCUTANEOUS

## 2022-03-24 MED ORDER — INSULIN GLARGINE-YFGN 100 UNIT/ML ~~LOC~~ SOLN
20.0000 [IU] | Freq: Every day | SUBCUTANEOUS | Status: DC
  Administered 2022-03-24: 20 [IU] via SUBCUTANEOUS
  Filled 2022-03-24: qty 0.2

## 2022-03-24 MED ORDER — POTASSIUM CHLORIDE 10 MEQ/100ML IV SOLN
10.0000 meq | INTRAVENOUS | Status: AC
  Administered 2022-03-24 (×5): 10 meq via INTRAVENOUS
  Filled 2022-03-24 (×5): qty 100

## 2022-03-24 MED ORDER — INSULIN ASPART 100 UNIT/ML IJ SOLN
4.0000 [IU] | Freq: Three times a day (TID) | INTRAMUSCULAR | Status: DC
  Administered 2022-03-24 – 2022-03-25 (×2): 4 [IU] via SUBCUTANEOUS

## 2022-03-24 MED ORDER — INSULIN GLARGINE-YFGN 100 UNIT/ML ~~LOC~~ SOLN
20.0000 [IU] | Freq: Two times a day (BID) | SUBCUTANEOUS | Status: DC
  Administered 2022-03-24: 20 [IU] via SUBCUTANEOUS
  Filled 2022-03-24 (×2): qty 0.2

## 2022-03-24 MED ORDER — LACTATED RINGERS IV BOLUS
1000.0000 mL | Freq: Once | INTRAVENOUS | Status: AC
  Administered 2022-03-24: 1000 mL via INTRAVENOUS

## 2022-03-24 MED ORDER — LIVING WELL WITH DIABETES BOOK
Freq: Once | Status: DC
  Filled 2022-03-24: qty 1

## 2022-03-24 MED ORDER — INSULIN STARTER KIT- PEN NEEDLES (ENGLISH)
1.0000 | Freq: Once | Status: DC
  Filled 2022-03-24: qty 1

## 2022-03-24 MED ORDER — INSULIN ASPART 100 UNIT/ML IJ SOLN
0.0000 [IU] | Freq: Every day | INTRAMUSCULAR | Status: DC
  Administered 2022-03-24: 2 [IU] via SUBCUTANEOUS

## 2022-03-24 NOTE — Progress Notes (Signed)
Diabetes nutritional teaching done.  Patient very receptive.  Wanted to watch me admin SQ insulin first so will have to admin next dose.

## 2022-03-24 NOTE — Progress Notes (Signed)
PROGRESS NOTE  Victor Duncan KKX:[id number] DOB: [birth year]   PCP: Victor Duncan, [name]  Victor Duncan is from: Home  DOA: 03/23/2022 LOS: 1  Chief complaints Chief Complaint  Victor Duncan presents with   Hyperglycemia     Brief Narrative / Interim history: 27 year old M with PMH of prediabetes and morbid obesity presenting with nausea, vomiting, appetite loss and abdominal discomfort, and admitted for new onset diabetes with diabetic ketoacidosis and AKI.  Glucose 683.  Bicarb 18.  Anion gap 21.  BHB 4.66.  Started on IV fluid and insulin drips.  A1c 13.1%.  Subjective: Seen and examined earlier this morning.  No major events overnight of this morning.  No complaints other than mild dry cough.  Denies shortness of breath, chest pain, GI or UTI symptoms.  Does not feel he is making good amount of urine.  Victor Duncan's mother available over the phone during encounter.  Objective: Vitals:   03/24/22 0545 03/24/22 0600 03/24/22 0700 03/24/22 0800  BP:  (!) 148/85 (!) 156/88 (!) 142/81  Pulse: 81 74 67 72  Resp: 10 20 15 11   Temp:    97.9 F (36.6 C)  TempSrc:    Oral  SpO2: 99% 99% 100% 100%  Weight:      Height:        Examination:  GENERAL: No apparent distress.  Nontoxic. HEENT: MMM.  Vision and hearing grossly intact.  NECK: Supple.  No apparent JVD.  RESP:  No IWOB.  Fair aeration bilaterally. CVS:  RRR. Heart sounds normal.  ABD/GI/GU: BS+. Abd soft, NTND.  MSK/EXT:  Moves extremities. No apparent deformity. No edema.  SKIN: no apparent skin lesion or wound NEURO: Awake, alert and oriented appropriately.  No apparent focal neuro deficit. PSYCH: Calm. Normal affect.   Procedures:  None  Microbiology summarized: None  Assessment and plan: Principal Problem:   DKA (diabetic ketoacidosis) ([place]) Active Problems:   New onset type 2 diabetes mellitus ([place])   AKI (acute kidney injury) ([place])   Secondary polycythemia   Hypokalemia   Hyponatremia   Morbid obesity ([place])  New  onset type 2 diabetes with diabetic ketoacidosis: A1c 13.1%.  Anion gap closed.  BHA proved from 4.6-1.54. Recent Labs  Lab 03/24/22 0234 03/24/22 0340 03/24/22 0444 03/24/22 0548 03/24/22 0651  GLUCAP 185* 129* 189* 232* 251*  -LR bolus 1 L -Continue IV fluid, insulin drip -CBG every hours.  BMP every 4 hours.  BHH every 8 hours. -Check lipid panel -Diabetic cognitive consulted  AKI: Resolved. Recent Labs    03/23/22 1119 03/23/22 1527 03/24/22 0019 03/24/22 0522 03/24/22 0755  BUN 24* 21* 18 17 17   CREATININE 1.70* 1.49* 1.32* 1.24 1.19  -Continue monitoring  Hypokalemia: -Monitor replenish as appropriate  Hyponatremia: Likely from hyperglycemia.  Resolved.  Morbid obesity: Body mass index is 35.19 kg/m.           DVT prophylaxis:  enoxaparin (LOVENOX) injection 40 mg Start: 03/23/22 1600  Code Status: Full code Family Communication: Updated Victor Duncan's mother from Victor Duncan's phone at bedside Level of care: Stepdown Status is: Inpatient Remains inpatient appropriate because: New onset diabetes with diabetic ketoacidosis and hyperglycemia   Final disposition: Likely home in the next 24 to 48 hours Consultants:  Diabetic coordinator  55 minutes with more than 50% spent in reviewing records, counseling Victor Duncan/family and coordinating care.   Sch Meds:  Scheduled Meds:  Chlorhexidine Gluconate Cloth  6 each Topical Daily   enoxaparin (LOVENOX) injection  40 mg Subcutaneous Q24H  pantoprazole  40 mg Oral Daily   Continuous Infusions:  sodium chloride Stopped (03/23/22 1534)   dextrose 5% lactated ringers 125 mL/hr at 03/24/22 0736   insulin 5.5 Units/hr (03/24/22 0805)   lactated ringers     lactated ringers 125 mL/hr at 03/23/22 1738   PRN Meds:.sodium chloride, acetaminophen, dextromethorphan-guaiFENesin, dextrose, guaiFENesin, hydrALAZINE, ipratropium-albuterol, metoprolol tartrate, ondansetron (ZOFRAN) IV, oxyCODONE, senna-docusate,  traZODone  Antimicrobials: Anti-infectives (From admission, onward)    None        I have personally reviewed the following labs and images: CBC: Recent Labs  Lab 03/23/22 1119 03/23/22 1128 03/23/22 1527 03/24/22 0522  WBC 9.5  --  10.7* 7.3  NEUTROABS 6.3  --   --   --   HGB 19.0* 20.1* 17.7* 14.6  HCT 54.9* 59.0* 51.7 44.0  MCV 80.3  --  81.2 83.3  PLT 229  --  231 168   BMP &GFR Recent Labs  Lab 03/23/22 1119 03/23/22 1128 03/23/22 1527 03/24/22 0019 03/24/22 0522 03/24/22 0755  NA 130* 133* 140 137 136 135  K 5.1 5.3* 3.8 2.7* 3.3* 3.5  CL 91*  --  101 102 99 102  CO2 18*  --  23 25 25 25   GLUCOSE 683*  --  317* 224* 221* 232*  BUN 24*  --  21* 18 17 17   CREATININE 1.70*  --  1.49* 1.32* 1.24 1.19  CALCIUM 9.5  --  9.4 8.3* 8.2* 8.0*  MG  --   --   --   --  2.1  --    Estimated Creatinine Clearance: 97.6 mL/min (by C-G formula based on SCr of 1.19 mg/dL). Liver & Pancreas: Recent Labs  Lab 03/23/22 1119  AST 22  ALT 32  ALKPHOS 147*  BILITOT 1.6*  PROT 9.1*  ALBUMIN 4.8   No results for input(s): "LIPASE", "AMYLASE" in the last 168 hours. No results for input(s): "AMMONIA" in the last 168 hours. Diabetic: Recent Labs    03/23/22 1527  HGBA1C 13.1*   Recent Labs  Lab 03/24/22 0234 03/24/22 0340 03/24/22 0444 03/24/22 0548 03/24/22 0651  GLUCAP 185* 129* 189* 232* 251*   Cardiac Enzymes: No results for input(s): "CKTOTAL", "CKMB", "CKMBINDEX", "TROPONINI" in the last 168 hours. No results for input(s): "PROBNP" in the last 8760 hours. Coagulation Profile: No results for input(s): "INR", "PROTIME" in the last 168 hours. Thyroid Function Tests: No results for input(s): "TSH", "T4TOTAL", "FREET4", "T3FREE", "THYROIDAB" in the last 72 hours. Lipid Profile: No results for input(s): "CHOL", "HDL", "LDLCALC", "TRIG", "CHOLHDL", "LDLDIRECT" in the last 72 hours. Anemia Panel: No results for input(s): "VITAMINB12", "FOLATE", "FERRITIN",  "TIBC", "IRON", "RETICCTPCT" in the last 72 hours. Urine analysis:    Component Value Date/Time   COLORURINE STRAW (A) 03/23/2022 1112   APPEARANCEUR CLEAR 03/23/2022 1112   LABSPEC 1.010 03/23/2022 1112   PHURINE 5.0 03/23/2022 1112   GLUCOSEU >=500 (A) 03/23/2022 1112   HGBUR TRACE (A) 03/23/2022 1112   BILIRUBINUR NEGATIVE 03/23/2022 1112   KETONESUR 80 (A) 03/23/2022 1112   PROTEINUR NEGATIVE 03/23/2022 1112   NITRITE NEGATIVE 03/23/2022 1112   LEUKOCYTESUR NEGATIVE 03/23/2022 1112   Sepsis Labs: Invalid input(s): "PROCALCITONIN", "LACTICIDVEN"  Microbiology: No results found for this or any previous visit (from the past 240 hour(s)).  Radiology Studies: No results found.    Staci Carver T. Graettinger  If 7PM-7AM, please contact night-coverage [email or web address] 03/24/2022, 9:19 AM

## 2022-03-25 DIAGNOSIS — N179 Acute kidney failure, unspecified: Secondary | ICD-10-CM | POA: Diagnosis not present

## 2022-03-25 DIAGNOSIS — E876 Hypokalemia: Secondary | ICD-10-CM | POA: Diagnosis not present

## 2022-03-25 DIAGNOSIS — E1110 Type 2 diabetes mellitus with ketoacidosis without coma: Secondary | ICD-10-CM | POA: Diagnosis not present

## 2022-03-25 DIAGNOSIS — E871 Hypo-osmolality and hyponatremia: Secondary | ICD-10-CM | POA: Diagnosis not present

## 2022-03-25 LAB — CBC
HCT: 41.7 % (ref 39.0–52.0)
Hemoglobin: 14 g/dL (ref 13.0–17.0)
MCH: 27.9 pg (ref 26.0–34.0)
MCHC: 33.6 g/dL (ref 30.0–36.0)
MCV: 83.1 fL (ref 80.0–100.0)
Platelets: 152 10*3/uL (ref 150–400)
RBC: 5.02 MIL/uL (ref 4.22–5.81)
RDW: 12.9 % (ref 11.5–15.5)
WBC: 7 10*3/uL (ref 4.0–10.5)
nRBC: 0 % (ref 0.0–0.2)

## 2022-03-25 LAB — BASIC METABOLIC PANEL
Anion gap: 10 (ref 5–15)
BUN: 15 mg/dL (ref 6–20)
CO2: 23 mmol/L (ref 22–32)
Calcium: 8.4 mg/dL — ABNORMAL LOW (ref 8.9–10.3)
Chloride: 101 mmol/L (ref 98–111)
Creatinine, Ser: 0.99 mg/dL (ref 0.61–1.24)
GFR, Estimated: >60 mL/min (ref >60)
Glucose, Bld: 223 mg/dL — ABNORMAL HIGH (ref 70–99)
Potassium: 3.7 mmol/L (ref 3.5–5.1)
Sodium: 134 mmol/L — ABNORMAL LOW (ref 135–145)

## 2022-03-25 LAB — GLUCOSE, CAPILLARY
Glucose-Capillary: 221 mg/dL — ABNORMAL HIGH (ref 70–99)
Glucose-Capillary: 215 mg/dL — ABNORMAL HIGH (ref 70–99)

## 2022-03-25 LAB — LIPID PANEL
Cholesterol: 187 mg/dL (ref 0–200)
HDL: 19 mg/dL — ABNORMAL LOW (ref >40)
LDL Cholesterol: 138 mg/dL — ABNORMAL HIGH (ref 0–99)
Total CHOL/HDL Ratio: 9.8 RATIO
Triglycerides: 149 mg/dL (ref <150)
VLDL: 30 mg/dL (ref 0–40)

## 2022-03-25 LAB — MAGNESIUM: Magnesium: 1.8 mg/dL (ref 1.7–2.4)

## 2022-03-25 MED ORDER — INSULIN GLARGINE-YFGN 100 UNIT/ML ~~LOC~~ SOLN
25.0000 [IU] | Freq: Two times a day (BID) | SUBCUTANEOUS | Status: DC
  Administered 2022-03-25: 25 [IU] via SUBCUTANEOUS
  Filled 2022-03-25 (×2): qty 0.25

## 2022-03-25 MED ORDER — BLOOD GLUCOSE METER KIT: PACK | 0 refills | Status: AC

## 2022-03-25 MED ORDER — ATORVASTATIN CALCIUM 20 MG PO TABS: 20.0000 mg | ORAL_TABLET | Freq: Every day | ORAL | 11 refills | Status: AC

## 2022-03-25 MED ORDER — INSULIN ASPART 100 UNIT/ML IJ SOLN
6.0000 [IU] | Freq: Three times a day (TID) | INTRAMUSCULAR | Status: DC
  Administered 2022-03-25: 6 [IU] via SUBCUTANEOUS

## 2022-03-25 MED ORDER — NOVOLIN 70/30 FLEXPEN RELION (70-30) 100 UNIT/ML ~~LOC~~ SUPN: 35.0000 [IU] | PEN_INJECTOR | Freq: Two times a day (BID) | SUBCUTANEOUS | 11 refills | Status: AC

## 2022-03-25 MED ORDER — "PEN NEEDLES 3/16"" 31G X 5 MM MISC": 1.0000 | Freq: Two times a day (BID) | 1 refills | Status: AC

## 2022-03-25 NOTE — TOC Initial Note (Signed)
Transition of Care [facility]) - Initial/Assessment Note   Patient Details  Name: Victor Duncan MRN: [id number] Date of Birth: 15-Jun-1995  [facility] CM/SW Contact:    Sherie Don, LCSW Phone Number: 03/25/2022, 11:47 AM  Clinical Narrative: Patient does not have a PCP and has a new diagnosis of type 2 diabetes. CSW spoke with patient regarding a PCP referral. Patient reported he will find one in [facility] and declined to have TOC find a PCP for him. TOC signing off.  Expected Discharge Plan: Home/Self Care Barriers to Discharge: No Barriers Identified  Patient Goals and CMS Choice Choice offered to / list presented to : NA  Expected Discharge Plan and Services In-house Referral: Clinical Social Work Discharge Planning Services: NA Post Acute Care Choice: NA Living arrangements for the past 2 months: Single Family Home            DME Arranged: N/A DME Agency: NA  Prior Living Arrangements/Services Living arrangements for the past 2 months: Single Family Home Patient language and need for interpreter reviewed:: Yes Do you feel safe going back to the place where you live?: Yes      Care giver support system in place?: Yes (comment) Criminal Activity/Legal Involvement Pertinent to Current Situation/Hospitalization: No - Comment as needed  Activities of Daily Living Home Assistive Devices/Equipment: None ADL Screening (condition at time of admission) Patient's cognitive ability adequate to safely complete daily activities?: Yes Is the patient deaf or have difficulty hearing?: No Does the patient have difficulty seeing, even when wearing glasses/contacts?: No Does the patient have difficulty concentrating, remembering, or making decisions?: No Patient able to express need for assistance with ADLs?: Yes Does the patient have difficulty dressing or bathing?: No Independently performs ADLs?: Yes (appropriate for developmental age) Does the patient have difficulty walking  or climbing stairs?: No Weakness of Legs: None Weakness of Arms/Hands: None  Emotional Assessment Attitude/Demeanor/Rapport: Engaged Affect (typically observed): Appropriate Orientation: : Oriented to Self, Oriented to Place, Oriented to  Time, Oriented to Situation Alcohol / Substance Use: Not Applicable Psych Involvement: No (comment)  Admission diagnosis:  Diabetic ketoacidosis without coma associated with type 2 diabetes mellitus ([place]) [E11.10] Hyperosmolar hyperglycemic state (HHS) ([place]) [E11.00] Patient Active Problem List   Diagnosis Date Noted   New onset type 2 diabetes mellitus ([place]) 03/24/2022   AKI (acute kidney injury) ([place]) 03/24/2022   Secondary polycythemia 03/24/2022   Hypokalemia 03/24/2022   Hyponatremia 03/24/2022   Morbid obesity ([place]) 03/24/2022   DKA (diabetic ketoacidosis) ([place]) 03/23/2022   PCP:  Patient, [name] Pharmacy:   [id number] - [place], [place] - [address] [place] [address] Phone: [phone] Fax: [phone]  Social Determinants of Health (SDOH) Social History: SDOH Screenings   Food Insecurity: No Food Insecurity (03/23/2022)  Housing: Low Risk  (03/23/2022)  Transportation Needs: No Transportation Needs (03/23/2022)  Utilities: Not At Risk (03/23/2022)  Tobacco Use: Low Risk  (03/23/2022)   SDOH Interventions:    Readmission Risk Interventions     No data to display

## 2022-03-25 NOTE — Progress Notes (Signed)
Did more discharge teaching on diabetes control/management. Patient was very receptive and understood teaching.

## 2022-03-25 NOTE — Inpatient Diabetes Management (Signed)
Inpatient Diabetes Program Recommendations  AACE/ADA: New Consensus Statement on Inpatient Glycemic Control (2015)  Target Ranges:  Prepandial:   less than 140 mg/dL      Peak postprandial:   less than 180 mg/dL (1-2 hours)      Critically ill patients:  140 - 180 mg/dL   Lab Results  Component Value Date   GLUCAP 215 (H) 03/25/2022   HGBA1C 13.1 (H) 03/23/2022    Review of Glycemic Control  Diabetes history: New-onset DM2 Outpatient Diabetes medications: None Current orders for Inpatient glycemic control: Semglee 25 BID, Novolog 0-15 TID with meals and 0-5 HS + 6 units TID  HgbA1C - 13.1%  Inpatient Diabetes Program Recommendations:   Spoke with pt and Mother at bedside regarding new diagnosis of DM2.  Discussed A1C results (13.1%) and explained what an A1C is and informed patient that his current A1C indicates an average glucose of 329 mg/dl over the past 2-3 months. Discussed basic pathophysiology of DM Type 2, basic home care, importance of checking CBGs and maintaining good CBG control to prevent long-term and short-term complications. Reviewed glucose and A1C goals and explained that patient  Reviewed signs and symptoms of hyperglycemia and hypoglycemia along with treatment for both. Discussed impact of nutrition, exercise, stress, sickness, and medications on diabetes control. Reviewed Living Well with diabetes booklet and encouraged patient to read through entire book. Will need to monitor blood sugars at least 3x/day and take logbook to PCP for review.  Educated patient on insulin pen use at home. Reviewed contents of insulin flexpen starter kit. Reviewed all steps if insulin pen including attachment of needle, 2-unit air shot, dialing up dose, giving injection, removing needle, disposal of sharps, storage of unused insulin, disposal of insulin etc. Patient able to provide successful return demonstration. Also reviewed troubleshooting with insulin pen. MD to give patient Rxs for  insulin pens and insulin pen needles.  Answered all questions. Pt making appt with Mother's PCP and will get appt within a week or two. Prefers going home on 2 shots/day instead of 4. Discussed 70/30 with patient and this would work better for him with his work schedule.   Consider Novolin ReliOn Flexpen 70/30 35 units BID (before breakfast and before dinner)  Discussed above with RN.   Thank you. Lorenda Peck, RD, LDN, [place] Inpatient Diabetes Coordinator [phone]

## 2022-03-25 NOTE — Discharge Summary (Signed)
Physician Discharge Summary  Victor Duncan BSW:[id number] DOB: 23-Oct-1995 DOA: 03/23/2022  PCP: Patient, [name]  Admit date: 03/23/2022 Discharge date: 03/25/2022 Admitted From: Home Disposition: Home Recommendations for Outpatient Follow-up:  Patient planning to follow-up with mother's doctor.  Check glycemic control at follow-up. Needs diabetic eye evaluation as soon as possible Please follow up on the following pending results: C-peptide  Home Health: Not indicated Equipment/Devices: Not indicated  Discharge Condition: Stable CODE STATUS: Full code  Hospital course 27 year old M with PMH of prediabetes and morbid obesity presenting with nausea, vomiting, appetite loss and abdominal discomfort, and admitted for new onset diabetes with diabetic ketoacidosis and AKI.  Glucose 683.  Bicarb 18.  Anion gap 21.  BHB 4.66.  Started on IV fluid and insulin drips.  A1c 13.1%.   The next day, ketoacidosis resolved.  GI symptoms resolved.  Hyperglycemia improved.  Transition to subcu insulin. On the day of discharge, hyperglycemia improved.  Evaluated and educated by diabetic coordinator.  Patient prefers going home on insulin range of motion where he cannot administer twice a day instead of 4 times a day which would work better for him with his work schedule.  As such, he is discharged on 70/30 insulin at 35 units twice daily.  Prescriptions for insulin, diabetic supplies including insulin pen needle, glucometer, test strips and lancets provided.    See individual problem list below for more.   Problems addressed during this hospitalization Principal Problem:   DKA (diabetic ketoacidosis) ([place]) Active Problems:   New onset type 2 diabetes mellitus ([place])   AKI (acute kidney injury) ([place])   Secondary polycythemia   Hypokalemia   Hyponatremia   Morbid obesity ([place])   New onset type 2 diabetes with diabetic ketoacidosis, hyperglycemia and hyperlipidemia: A1c 13.1%.  LDL 138.  DKA  resolved. Recent Labs  Lab 03/24/22 1457 03/24/22 1707 03/24/22 2058 03/25/22 0735 03/25/22 1151  GLUCAP 163* 300* 234* 221* 215*  -Prescription for insulin and supplies provided. -Lipitor 20 mg daily -Diabetic eye exam as soon as possible -Patient to establish care with PCP.  Patient's mother to help  AKI: Likely due to the above.  Resolved.  Hypokalemia: Resolved. -Monitor replenish as appropriate   Hyponatremia: Corrects to normal for hyperglycemia.  Secondary polycythemia: Likely due to hydration.  Resolved.   Morbid obesity: Body mass index is 35.19 kg/m.            Vital signs Vitals:   03/25/22 0116 03/25/22 0531 03/25/22 0933 03/25/22 1357  BP: 133/76 (!) 151/76 (!) 140/81 (!) 146/88  Pulse: 72 74 86 89  Temp: 98.3 F (36.8 C) 97.8 F (36.6 C) 98.2 F (36.8 C) 98.3 F (36.8 C)  Resp: 18 18 16 14   Height:      Weight:      SpO2: 100% 99% 99% 99%  TempSrc: Oral Oral Oral Oral  BMI (Calculated):         Discharge exam  GENERAL: No apparent distress.  Nontoxic. HEENT: MMM.  Vision and hearing grossly intact.  NECK: Supple.  No apparent JVD.  RESP:  No IWOB.  Fair aeration bilaterally. CVS:  RRR. Heart sounds normal.  ABD/GI/GU: BS+. Abd soft, NTND.  MSK/EXT:  Moves extremities. No apparent deformity. No edema.  SKIN: no apparent skin lesion or wound NEURO: Awake and alert. Oriented appropriately.  No apparent focal neuro deficit. PSYCH: Calm. Normal affect.   Discharge Instructions Discharge Instructions     Call MD for:  extreme fatigue  Complete by: As directed    Call MD for:  persistant dizziness or light-headedness   Complete by: As directed    Call MD for:  persistant nausea and vomiting   Complete by: As directed    Diet Carb Modified   Complete by: As directed    Discharge instructions   Complete by: As directed    It has been a pleasure taking care of you!  You were hospitalized due to uncontrolled diabetes for which you  have been started on insulin.  Please use your insulin as prescribed.  We have also started you on cholesterol medication.  Please establish care with primary care doctor as soon as possible.  We also recommend lifestyle change including daily exercise and watching your diet.  See separate instruction about diet and diabetes.  You need follow-up with ophthalmology for diabetic eye exam as soon as possible.   Take care,   Increase activity slowly   Complete by: As directed       Allergies as of 03/25/2022   No Known Allergies      Medication List     TAKE these medications    atorvastatin 20 MG tablet Commonly known as: Lipitor Take 1 tablet (20 mg total) by mouth daily.   blood glucose meter kit and supplies Dispense based on patient and insurance preference. Use up to four times daily as directed. (FOR ICD-10 E10.9, E11.9).   NovoLIN 70/30 Kwikpen (70-30) 100 UNIT/ML KwikPen Generic drug: insulin isophane & regular human KwikPen Inject 35 Units into the skin 2 (two) times daily after a meal. After breakfast and dinner   Pen Needles 3/16" 31G X 5 MM Misc 1 Pen by Does not apply route in the morning and at bedtime.        Consultations: Diabetic coordinator  Procedures/Studies: None   No results found.     The results of significant diagnostics from this hospitalization (including imaging, microbiology, ancillary and laboratory) are listed below for reference.     Microbiology: No results found for this or any previous visit (from the past 240 hour(s)).   Labs:  CBC: Recent Labs  Lab 03/23/22 1119 03/23/22 1128 03/23/22 1527 03/24/22 0522 03/25/22 0409  WBC 9.5  --  10.7* 7.3 7.0  NEUTROABS 6.3  --   --   --   --   HGB 19.0* 20.1* 17.7* 14.6 14.0  HCT 54.9* 59.0* 51.7 44.0 41.7  MCV 80.3  --  81.2 83.3 83.1  PLT 229  --  231 168 152   BMP &GFR Recent Labs  Lab 03/23/22 2045 03/24/22 0019 03/24/22 0522 03/24/22 0755 03/25/22 0409  NA 139 137  136 135 134*  K 3.1* 2.7* 3.3* 3.5 3.7  CL 101 102 99 102 101  CO2 25 25 25 25 23   GLUCOSE 188* 224* 221* 232* 223*  BUN 18 18 17 17 15   CREATININE 1.31* 1.32* 1.24 1.19 0.99  CALCIUM 8.5* 8.3* 8.2* 8.0* 8.4*  MG  --   --  2.1  --  1.8   Estimated Creatinine Clearance: 117.3 mL/min (by C-G formula based on SCr of 0.99 mg/dL). Liver & Pancreas: Recent Labs  Lab 03/23/22 1119  AST 22  ALT 32  ALKPHOS 147*  BILITOT 1.6*  PROT 9.1*  ALBUMIN 4.8   No results for input(s): "LIPASE", "AMYLASE" in the last 168 hours. No results for input(s): "AMMONIA" in the last 168 hours. Diabetic: Recent Labs    03/23/22 1527  HGBA1C  13.1*   Recent Labs  Lab 03/24/22 1457 03/24/22 1707 03/24/22 2058 03/25/22 0735 03/25/22 1151  GLUCAP 163* 300* 234* 221* 215*   Cardiac Enzymes: No results for input(s): "CKTOTAL", "CKMB", "CKMBINDEX", "TROPONINI" in the last 168 hours. No results for input(s): "PROBNP" in the last 8760 hours. Coagulation Profile: No results for input(s): "INR", "PROTIME" in the last 168 hours. Thyroid Function Tests: No results for input(s): "TSH", "T4TOTAL", "FREET4", "T3FREE", "THYROIDAB" in the last 72 hours. Lipid Profile: Recent Labs    03/25/22 0409  CHOL 187  HDL 19*  LDLCALC 138*  TRIG 149  CHOLHDL 9.8   Anemia Panel: No results for input(s): "VITAMINB12", "FOLATE", "FERRITIN", "TIBC", "IRON", "RETICCTPCT" in the last 72 hours. Urine analysis:    Component Value Date/Time   COLORURINE STRAW (A) 03/23/2022 1112   APPEARANCEUR CLEAR 03/23/2022 1112   LABSPEC 1.010 03/23/2022 1112   PHURINE 5.0 03/23/2022 1112   GLUCOSEU >=500 (A) 03/23/2022 1112   HGBUR TRACE (A) 03/23/2022 1112   BILIRUBINUR NEGATIVE 03/23/2022 1112   KETONESUR 80 (A) 03/23/2022 1112   PROTEINUR NEGATIVE 03/23/2022 1112   NITRITE NEGATIVE 03/23/2022 1112   LEUKOCYTESUR NEGATIVE 03/23/2022 1112   Sepsis Labs: Invalid input(s): "PROCALCITONIN",  "LACTICIDVEN"   SIGNED:  Mercy Riding, MD  [facility] 03/25/2022, 3:32 PM

## 2022-03-27 LAB — C-PEPTIDE: C-Peptide: 0.8 ng/mL — ABNORMAL LOW (ref 1.1–4.4)
